# Patient Record
Sex: Female | Born: 2000 | Race: White | Hispanic: No | Marital: Single | Smoking: Never smoker
Health system: Southern US, Community
[De-identification: ages and names within clinical notes are randomized; demographics above are authoritative.]

## PROBLEM LIST (undated history)

## (undated) DIAGNOSIS — F319 Bipolar disorder, unspecified: Secondary | ICD-10-CM

## (undated) DIAGNOSIS — F419 Anxiety disorder, unspecified: Secondary | ICD-10-CM

## (undated) DIAGNOSIS — R454 Irritability and anger: Secondary | ICD-10-CM

## (undated) DIAGNOSIS — F99 Mental disorder, not otherwise specified: Secondary | ICD-10-CM

## (undated) DIAGNOSIS — T7422XA Child sexual abuse, confirmed, initial encounter: Secondary | ICD-10-CM

## (undated) HISTORY — DX: Mental disorder, not otherwise specified: F99

## (undated) HISTORY — DX: Bipolar disorder, unspecified: F31.9

## (undated) HISTORY — PX: OTHER SURGICAL HISTORY: SHX169

## (undated) HISTORY — DX: Child sexual abuse, confirmed, initial encounter: T74.22XA

## (undated) HISTORY — DX: Anxiety disorder, unspecified: F41.9

## (undated) HISTORY — DX: Irritability and anger: R45.4

---
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
## 2022-10-17 NOTE — Telephone Encounter (Signed)
Open in error
